# Patient Record
Sex: Male | Born: 1995 | Hispanic: Yes | Marital: Single | Smoking: Never smoker
Health system: Southern US, Community
[De-identification: ages and names within clinical notes are randomized; demographics above are authoritative.]

## PROBLEM LIST (undated history)

## (undated) HISTORY — PX: DENTAL SURGERY: SHX609

---

## 2015-06-21 ENCOUNTER — Encounter (HOSPITAL_COMMUNITY): Payer: Self-pay | Admitting: Emergency Medicine

## 2015-06-21 ENCOUNTER — Emergency Department (HOSPITAL_COMMUNITY): Payer: BLUE CROSS/BLUE SHIELD

## 2015-06-21 ENCOUNTER — Emergency Department (HOSPITAL_COMMUNITY)
Admission: EM | Admit: 2015-06-21 | Discharge: 2015-06-21 | Disposition: A | Discharge status: Home / Self Care | Payer: BLUE CROSS/BLUE SHIELD | Attending: Emergency Medicine | Admitting: Emergency Medicine

## 2015-06-21 DIAGNOSIS — R5383 Other fatigue: Secondary | ICD-10-CM | POA: Diagnosis not present

## 2015-06-21 DIAGNOSIS — R059 Cough, unspecified: Secondary | ICD-10-CM

## 2015-06-21 DIAGNOSIS — R05 Cough: Secondary | ICD-10-CM | POA: Diagnosis not present

## 2015-06-21 DIAGNOSIS — R0981 Nasal congestion: Secondary | ICD-10-CM | POA: Insufficient documentation

## 2015-06-21 DIAGNOSIS — R509 Fever, unspecified: Secondary | ICD-10-CM | POA: Diagnosis not present

## 2015-06-21 DIAGNOSIS — J3489 Other specified disorders of nose and nasal sinuses: Secondary | ICD-10-CM | POA: Insufficient documentation

## 2015-06-21 DIAGNOSIS — M791 Myalgia: Secondary | ICD-10-CM | POA: Diagnosis not present

## 2015-06-21 MED ORDER — BENZONATATE 100 MG PO CAPS: 100.0000 mg | ORAL_CAPSULE | Freq: Three times a day (TID) | ORAL | Status: AC

## 2015-06-21 MED ORDER — GUAIFENESIN ER 600 MG PO TB12: 600.0000 mg | ORAL_TABLET | Freq: Two times a day (BID) | ORAL | Status: AC

## 2015-06-21 MED ORDER — IBUPROFEN 800 MG PO TABS: 800.0000 mg | ORAL_TABLET | Freq: Three times a day (TID) | ORAL | Status: AC

## 2015-06-21 MED ORDER — BENZONATATE 100 MG PO CAPS
100.0000 mg | ORAL_CAPSULE | Freq: Once | ORAL | Status: AC
  Administered 2015-06-21: 100 mg via ORAL
  Filled 2015-06-21: qty 1

## 2015-06-21 NOTE — ED Notes (Signed)
Pt A/O x 3, no cough present, denies having sore throat.

## 2015-06-21 NOTE — Discharge Instructions (Signed)
You have been seen today for cough and nasal congestion. Your imaging showed no abnormalities. Follow up with PCP as needed. Return to ED should symptoms worsen.   Emergency Department Resource Guide 1) Find a Doctor and Pay Out of Pocket Although you won't have to find out who is covered by your insurance plan, it is a good idea to ask around and get recommendations. You will then need to call the office and see if the doctor you have chosen will accept you as a new patient and what types of options they offer for patients who are self-pay. Some doctors offer discounts or will set up payment plans for their patients who do not have insurance, but you will need to ask so you aren't surprised when you get to your appointment.  2) Contact Your Local Health Department Not all health departments have doctors that can see patients for sick visits, but many do, so it is worth a call to see if yours does. If you don't know where your local health department is, you can check in your phone book. The CDC also has a tool to help you locate your state's health department, and many state websites also have listings of all of their local health departments.  3) Find a Walk-in Clinic If your illness is not likely to be very severe or complicated, you may want to try a walk in clinic. These are popping up all over the country in pharmacies, drugstores, and shopping centers. They're usually staffed by nurse practitioners or physician assistants that have been trained to treat common illnesses and complaints. They're usually fairly quick and inexpensive. However, if you have serious medical issues or chronic medical problems, these are probably not your best option.  No Primary Care Doctor: - Call Health Connect at  [phone] - they can help you locate a primary care doctor that  accepts your insurance, provides certain services, etc. - Physician Referral Service- [phone]  Chronic Pain  Problems: Organization         Address  Phone   Notes  [facility]  [phone] Patients need to be referred by their primary care doctor.   Medication Assistance: Organization         Address  Phone   Notes  [facility] Medication [facility] [address] [place] [place], [place] [address] [phone] --Must be a resident of [facility] -- Must have NO insurance coverage whatsoever (no Medicaid/ Medicare, etc.) -- The pt. MUST have a primary care doctor that directs their care regularly and follows them in the community   [facility]  [phone]   [employer]  [phone]    Agencies that provide inexpensive medical care: Organization         Address  Phone   Notes  [facility]  [phone]   [facility]    [phone]   [facility] [address] [place], [place] [address] [phone]   [facility] [address] [place] [place] [phone]   Planned Parenthood    [phone]   [facility]    [phone]   [facility]  [address], [place] Phone:  [phone], Fax:  [phone] Hours of Operation:  9 am - 6 pm, M-F.  Also accepts Medicaid/Medicare and self-pay.  [facility]  [address], [place] Phone: [phone], Fax: [phone]. Hours of Operation:  8:30 am - 5:30 pm, M-F.  Also accepts Medicaid and self-pay.  [facility] [place] [address] [place] Phone: [phone]   [place], [place], [place] [phone], Ext. [phone] Mondays & Thursdays: 7-9 AM.  First 15 patients are seen on a first come, first serve basis.    [place] Providers:  Organization         Address  Phone   Notes  [facility] [address], [place] [phone] Also  accepts self-pay patients.  [facility] [address], [place]  [phone]   [place], [address], [place] [phone]   [facility] [address] [place] [phone]   Lucianne Lei [address], [place]   [phone] Only accepts [place] Access [place] patients after they have their name applied to their card.   Self-Pay (no insurance) in [facility]:  Organization         Address  Phone   Notes  Sickle Cell Patients, [facility] [place] [phone]   [facility] [place] [phone]   [facility] [place]  [place], [place], [place] [phone]   [facility]/Dr. Osei-Bonsu  [address] [place] or [address], [place] [phone] Phone number for both [place] and [place] locations is the same.  [facility] [address] [place] [phone]   [facility] [address] [place] or [address] [phone]   [facility] [address] [place] [phone], phone; [phone], fax Sees patients 1st and 3rd Saturday of every month.  Must not qualify for public or private insurance (i.e. Medicaid, Medicare, [place] Health Choice, Veterans' Benefits)  Household income should be no more than 200% of the poverty level The clinic cannot treat you if you are pregnant or think you are pregnant  Sexually transmitted diseases are not treated at the clinic.    Dental Care: Organization         Address  Phone  Notes  [facility] [phone] Accepts children up to age 80 who are enrolled in [place] or [place]; pregnant  women with a Medicaid card; and children who have applied for Medicaid or [place] Health Choice, but were declined, whose parents can pay a reduced fee at time of service.  [facility] Department of [facility]  [address], [place] [phone] Accepts children up to age 55 who are enrolled in [place] or [place]; pregnant women with a Medicaid card; and children who have applied for Medicaid or [place] Health Choice, but were declined, whose parents can pay a reduced fee at time of service.  [facility]  [place] [phone] Patients are seen by appointment only. Walk-ins are not accepted. [place] will see patients 4 years of age and older. Monday - Tuesday (8am-5pm) Most Wednesdays (8:30-5pm) $30 per visit, cash only  [place]  [address], [place] [phone] Patients are seen by appointment only. Walk-ins are not accepted. [facility] will see patients 18 years of age and older. °One Wednesday Evening (Monthly: Volunteer Based).  $30 per visit, cash only  °[facility]  [phone] for adults; Children under age 4, call Graduate Pediatric Dentistry at [phone]. Children aged 4-14, please call [phone] to request a pediatric application. ° Dental services are provided in all areas of dental care including fillings, crowns and bridges, complete and partial dentures, implants, gum treatment, root canals, and extractions. Preventive care is also provided. Treatment is provided to both adults and children. °Patients are selected via a lottery and there is often a waiting list. °  °[facility] [address], °[place] ° ([phone] [email or web address] °  °[facility] [address], [place], [place] [phone], Ext. [phone] Second and Fourth Thursday of each month, opens at 6:30 AM; Clinic ends at 9 AM.  Patients are  seen on a first-come first-served basis, and a limited number are seen during each clinic.  ° °[facility] ° [address], [place], [place] [phone]   Eligibility Requirements °You must have lived in [place], [place], or [place] counties for at least the last three months. °  You cannot be eligible for state or federal sponsored healthcare insurance, including Veterans Administration, Medicaid, or Medicare. °  You generally cannot be eligible for healthcare insurance through your employer.  °  How to apply: °Eligibility screenings are held every Tuesday and Wednesday afternoon from 1:00 pm until 4:00 pm. You do not need an appointment for the interview!  °[facility] [address], [place], [place] [phone]   °[facility]  [phone]   °[facility]  [phone]   °[facility]  336-570-6415   ° °Behavioral Health Resources in the Community: °Intensive Outpatient Programs °Organization         Address  Phone  Notes  °[facility] [address], [place], [place] [phone]   °[facility] [address], [place], [place] [phone]   °[facility] [address], [place], [place] ° [phone]   °[facility] [address],  °[place], [place] 1-800-853-5163 or 336-641-4981   °Substance Abuse Resources °Organization         Address  Phone  Notes  °[facility]  336-882-2125   °[facility]  [phone]   °[facility]  336-285-9073   °[facility]  336-845-3988   °Residential & Outpatient Substance Abuse Program  1-800-659-3381   °Psychological Services °Organization         Address  Phone  Notes  °[facility]  336- [phone]   °[facility]  336- [phone]   °[facility] [address], [place] 1-800-853-5163 or 336-641-4981   ° °Mobile Crisis  Teams °Organization         Address  Phone  Notes  °[facility], Mobile Crisis Care Unit  1-877-626-1772   °Assertive °[facility]otherapeutic Services ° [address] [place], [place] 336-834-9664   °Sharon DeEsch [address] °[place] 336-554-5454   ° °Self-Help/Support Groups °Organization         Address  Phone             Notes  °[facility] [place] -   variety of support groups  336- 336-389-6044 Call for more information  Narcotics Anonymous (NA), [facility] [address], [employer] [place]  2 meetings at this location   Residential Facilities manager         Address  Phone  Notes  [facility] [place],    [place]  [phone]   [facility]  [address] [place] [address], [place], [place]   [place], [place] [phone] Admissions: 8am-3pm M-F  [facility] [place] [address],    [place], [place] [phone]   [facility] [address] [place], [place], [place]   [facility] [address]  [place], [place]   [facility] - Intensive Outpatient [address], [place], [place]   [facility] [address]  [place], [place] 1-640-386-3300 or [phone]   [facility] ([facility]) [address] [place], [place] Accepts Medicaid  [facility] [address],  [place] 1-646-193-9807 Substance Abuse/Addiction Treatment   [facility] Organization         Address  Phone  Notes  [facility]  [phone]   Domenic Schwab, PhD [address] [place], [place]   ([phone]) [phone] or ([phone]   [facility] [place], [place] [phone]   [facility] [address] [place], [place] [phone]  Insurance/Medicaid/sponsorship through [facility] and Families [address] [place]                                    [place], [place] [phone] [place] [address], [place] [phone]    Dr. Adele Schilder  [phone]   [facility] Dept. 1) [address] [place] 2) [place] 3)  [place] [address], [place] [phone]  [phone]   [place] [phone] or [phone] (After Hours)

## 2015-06-21 NOTE — ED Provider Notes (Signed)
CSN: [id number]     Arrival date & time 06/21/15  1725 History  By signing my name below, I, Bethel Born, attest that this documentation has been prepared under the direction and in the presence of Shawn Joy PA-C. Electronically Signed: Bethel Born, ED Scribe. 06/21/2015 6:00 PM   Chief Complaint  Patient presents with  . Nasal Congestion    The history is provided by the patient. No language interpreter was used.   Jeffrey Keith is a 20 y.o. male with history of asthma who presents to the Emergency Department complaining of chest congestion and productive cough with thick brown sputum beginning 9 days ago. Associated symptoms include body aches, intermittent fever up to 101 F, chills,  and fatigue. Alka Seltzer and Advil have provided insufficient relief in symptoms at home. Patient denies shortness of breath, chest pain, nausea/vomiting, abdominal pain, or any other complaints.  History reviewed. No pertinent past medical history. Past Surgical History  Procedure Laterality Date  . Dental surgery     History reviewed. No pertinent family history. Social History  Substance Use Topics  . Smoking status: Never Smoker   . Smokeless tobacco: None  . Alcohol Use: Yes    Review of Systems  Constitutional: Positive for fever, chills and fatigue.  Respiratory: Positive for cough.   Musculoskeletal: Positive for myalgias.  All other systems reviewed and are negative.   Allergies  Review of patient's allergies indicates no known allergies.  Home Medications   Prior to Admission medications   Medication Sig Start Date End Date Taking? Authorizing Provider  benzonatate (TESSALON) 100 MG capsule Take 1 capsule (100 mg total) by mouth every 8 (eight) hours. 06/21/15   Shawn C Joy, PA-C  guaiFENesin (MUCINEX) 600 MG 12 hr tablet Take 1 tablet (600 mg total) by mouth 2 (two) times daily. 06/21/15   Shawn C Joy, PA-C  ibuprofen (ADVIL,MOTRIN) 800 MG tablet Take 1 tablet (800 mg  total) by mouth 3 (three) times daily. 06/21/15   Shawn C Joy, PA-C   BP 121/70 mmHg  Pulse 71  Temp(Src) 98.2 F (36.8 C) (Oral)  Resp 18  SpO2 96% Physical Exam  Constitutional: He is oriented to person, place, and time. He appears well-developed and well-nourished. No distress.  HENT:  Head: Normocephalic and atraumatic.  Nose: Rhinorrhea present.  Mouth/Throat: Oropharynx is clear and moist.  Eyes: Conjunctivae are normal.  Neck: Neck supple.  Cardiovascular: Normal rate, regular rhythm and normal heart sounds.   Pulmonary/Chest: Effort normal and breath sounds normal. No respiratory distress. He has no wheezes.  Abdominal: Soft. There is no tenderness.  Musculoskeletal: Normal range of motion.  Lymphadenopathy:    He has no cervical adenopathy.  Neurological: He is alert and oriented to person, place, and time.  Skin: Skin is warm and dry. He is not diaphoretic.  Psychiatric: He has a normal mood and affect. His behavior is normal.  Nursing note and vitals reviewed.   ED Course  Procedures (including critical care time) DIAGNOSTIC STUDIES: Oxygen Saturation is 96% on RA,  normal by my interpretation.    COORDINATION OF CARE: 5:58 PM Discussed treatment plan which includes CXR with pt at bedside and pt agreed to plan.  Labs Review Labs Reviewed - No data to display  Imaging Review Dg Chest 2 View  06/21/2015  CLINICAL DATA:  20 year old male with fatigue and cough for the past week EXAM: CHEST  2 VIEW COMPARISON:  None. FINDINGS: The lungs are clear and negative for  focal airspace consolidation, pulmonary edema or suspicious pulmonary nodule. No pleural effusion or pneumothorax. Cardiac and mediastinal contours are within normal limits. No acute fracture or lytic or blastic osseous lesions. The visualized upper abdominal bowel gas pattern is unremarkable. IMPRESSION: Normal chest x-ray. Electronically Signed   By: Malachy Moan M.D.   On: 06/21/2015 18:45   I have  personally reviewed and evaluated these images as part of my medical decision-making.   EKG Interpretation None      MDM   Final diagnoses:  Nasal congestion  Cough    Jeffrey Keith presents with a productive cough and nasal congestion for the last 9 days.  The patient's symptoms are consistent with viral illness. Patient is nontoxic appearing, afebrile, not tachycardic, not tachypneic, and is in no apparent distress. Patient has no signs of sepsis or other serious or life-threatening condition. Chest x-ray is without acute abnormalities with no signs of pneumonia or other concerning findings. Patient improved with conservative management here in the ED. Patient was discharged with Tessalon, Mucinex, and ibuprofen. The patient was given instructions for home care as well as return precautions. Patient voices understanding of these instructions, accepts the plan, and is comfortable with discharge.  I personally performed the services described in this documentation, which was scribed in my presence. The recorded information has been reviewed and is accurate.    Anselm Pancoast, PA-C 06/22/15 0751  Derwood Kaplan, MD 06/23/15 [phone]

## 2015-06-21 NOTE — ED Notes (Signed)
Pt states he has had congestion since last Wednesday but Monday started feeling very drowsy.  Pt took OTC meds and denies taking too many.

## 2016-09-30 IMAGING — CR DG CHEST 2V
2 series · 2 of 2 positions shown · non-contrast
Comparison: None.

CLINICAL DATA: 19-year-old male with fatigue and cough for the past
week

EXAM:
CHEST  2 VIEW

[w chest pa]
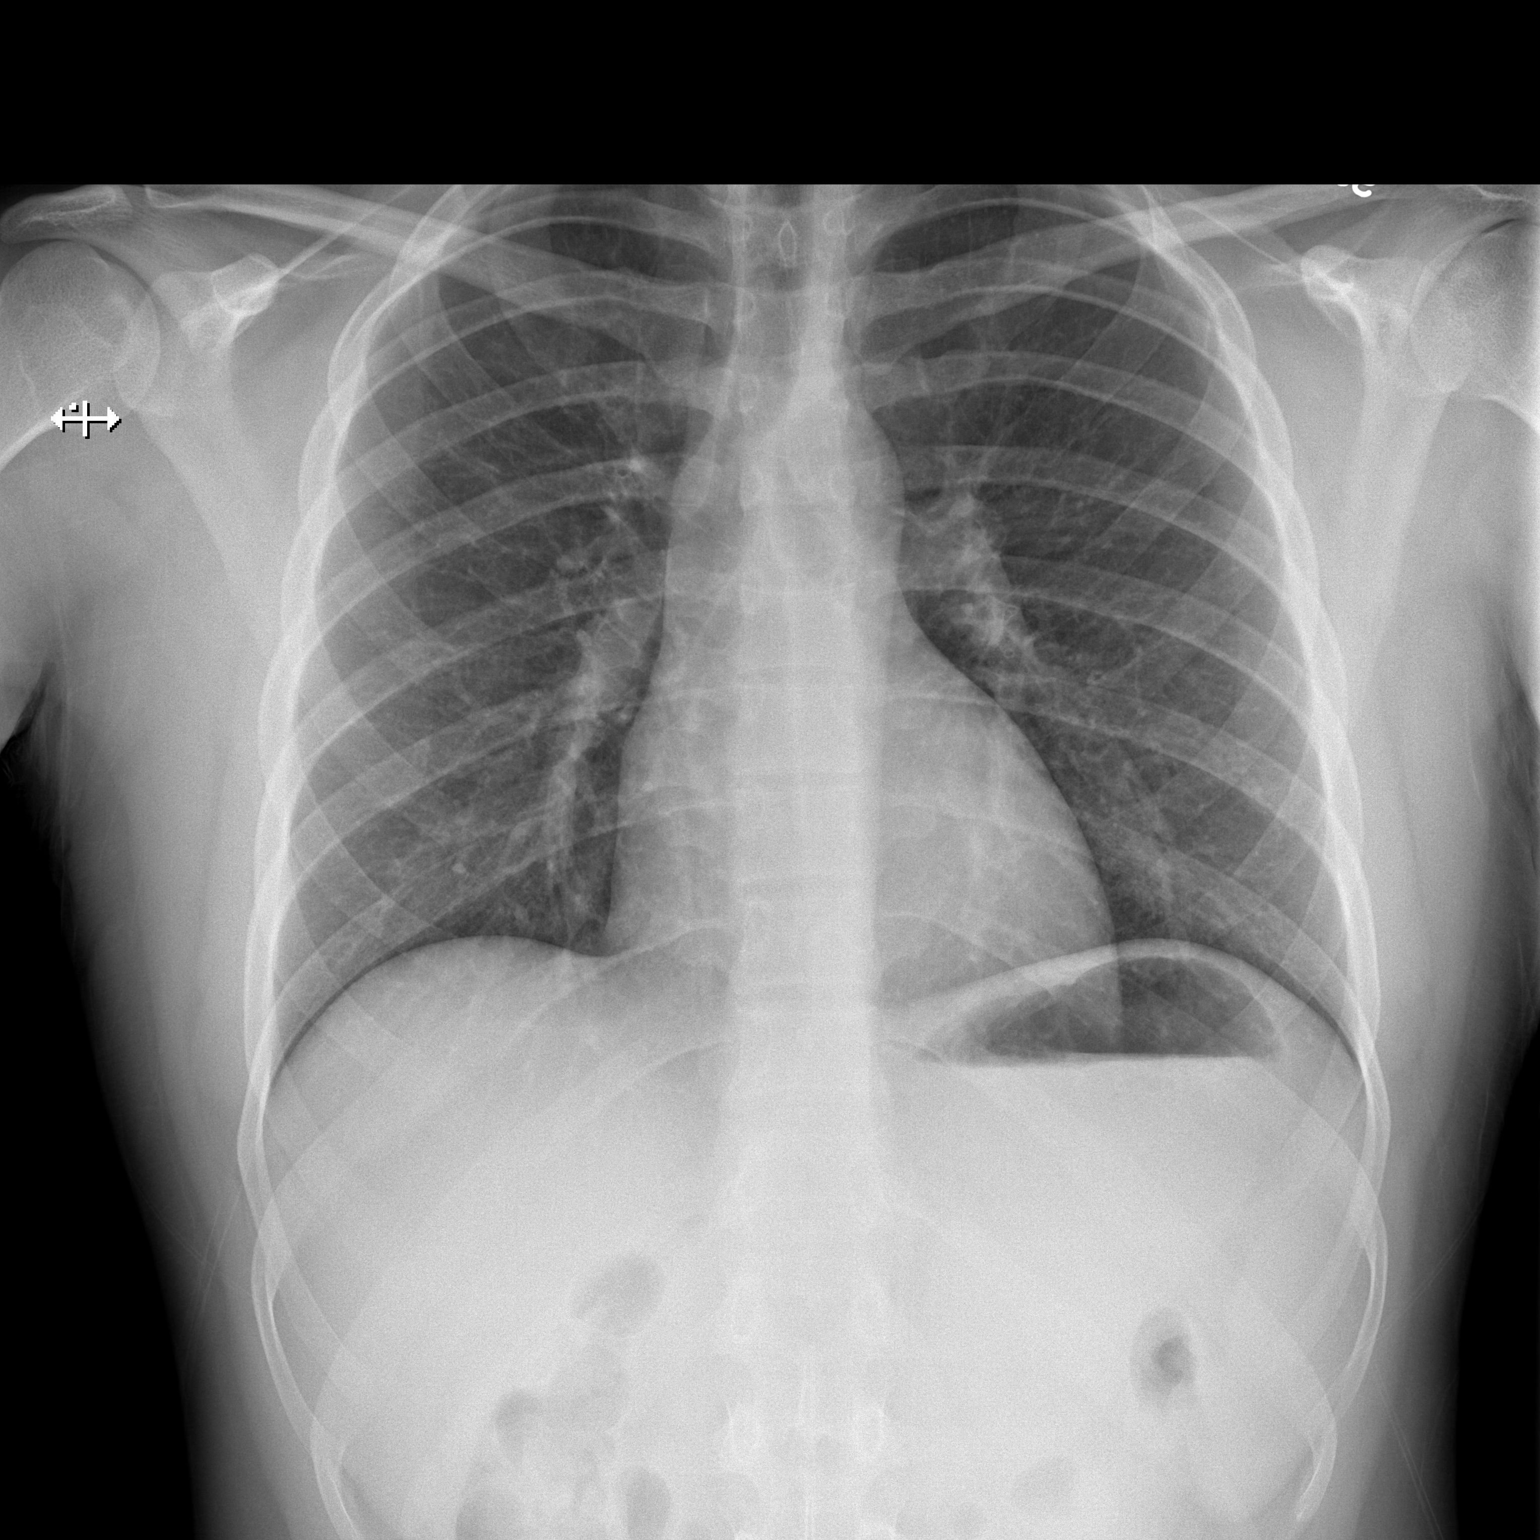

[w chest lat]
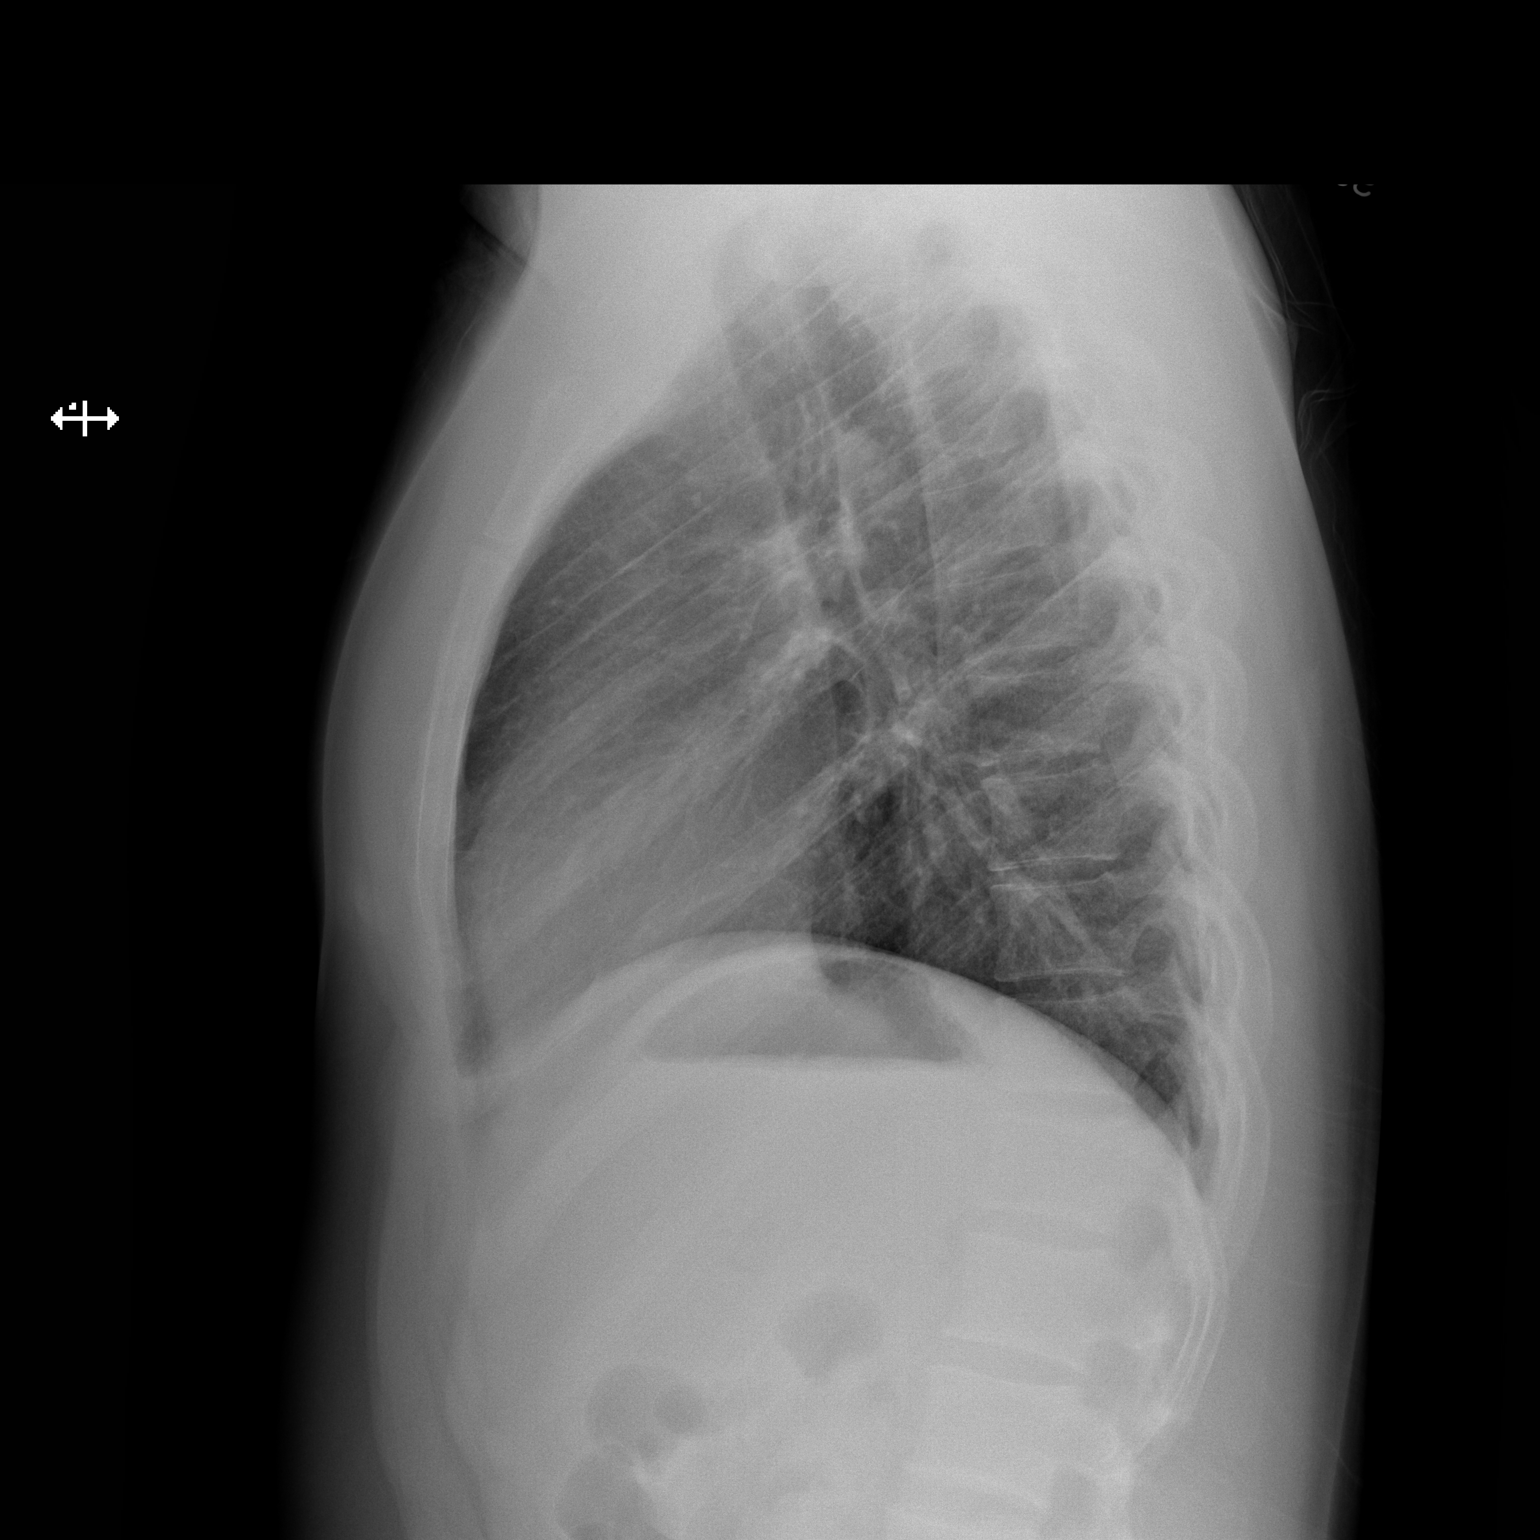

[2 of 2 positions shown; findings below may reference images not displayed]

FINDINGS: The lungs are clear and negative for focal airspace consolidation,
pulmonary edema or suspicious pulmonary nodule. No pleural effusion
or pneumothorax. Cardiac and mediastinal contours are within normal
limits. No acute fracture or lytic or blastic osseous lesions. The
visualized upper abdominal bowel gas pattern is unremarkable.
IMPRESSION: Normal chest x-ray.
